# Patient Record
Sex: Male | Born: 1935 | Race: White | Hispanic: No | Marital: Married | Smoking: Never smoker
Health system: Southern US, Community
[De-identification: ages and names within clinical notes are randomized; demographics above are authoritative.]

## PROBLEM LIST (undated history)

## (undated) DIAGNOSIS — E079 Disorder of thyroid, unspecified: Secondary | ICD-10-CM

## (undated) DIAGNOSIS — N289 Disorder of kidney and ureter, unspecified: Secondary | ICD-10-CM

## (undated) DIAGNOSIS — M1990 Unspecified osteoarthritis, unspecified site: Secondary | ICD-10-CM

## (undated) DIAGNOSIS — I10 Essential (primary) hypertension: Secondary | ICD-10-CM

## (undated) DIAGNOSIS — K649 Unspecified hemorrhoids: Secondary | ICD-10-CM

## (undated) HISTORY — PX: SHOULDER SURGERY: SHX246

## (undated) HISTORY — PX: TONSILLECTOMY: SUR1361

## (undated) HISTORY — PX: ANUS SURGERY: SHX302

## (undated) HISTORY — PX: EYE SURGERY: SHX253

---

## 2019-12-26 ENCOUNTER — Other Ambulatory Visit: Payer: Self-pay

## 2019-12-26 ENCOUNTER — Emergency Department (HOSPITAL_COMMUNITY): Payer: Medicare Other

## 2019-12-26 ENCOUNTER — Encounter (HOSPITAL_COMMUNITY): Payer: Self-pay

## 2019-12-26 ENCOUNTER — Emergency Department (HOSPITAL_COMMUNITY)
Admission: EM | Admit: 2019-12-26 | Discharge: 2019-12-29 | Disposition: A | Discharge status: Home / Self Care | Payer: Medicare Other | Attending: Emergency Medicine | Admitting: Emergency Medicine

## 2019-12-26 DIAGNOSIS — R2689 Other abnormalities of gait and mobility: Secondary | ICD-10-CM | POA: Insufficient documentation

## 2019-12-26 DIAGNOSIS — R42 Dizziness and giddiness: Secondary | ICD-10-CM | POA: Diagnosis present

## 2019-12-26 DIAGNOSIS — I6200 Nontraumatic subdural hemorrhage, unspecified: Secondary | ICD-10-CM | POA: Diagnosis not present

## 2019-12-26 DIAGNOSIS — I10 Essential (primary) hypertension: Secondary | ICD-10-CM | POA: Diagnosis not present

## 2019-12-26 DIAGNOSIS — R2681 Unsteadiness on feet: Secondary | ICD-10-CM

## 2019-12-26 DIAGNOSIS — S065X9A Traumatic subdural hemorrhage with loss of consciousness of unspecified duration, initial encounter: Secondary | ICD-10-CM

## 2019-12-26 DIAGNOSIS — Z20822 Contact with and (suspected) exposure to covid-19: Secondary | ICD-10-CM | POA: Insufficient documentation

## 2019-12-26 DIAGNOSIS — S065XAA Traumatic subdural hemorrhage with loss of consciousness status unknown, initial encounter: Secondary | ICD-10-CM

## 2019-12-26 HISTORY — DX: Essential (primary) hypertension: I10

## 2019-12-26 HISTORY — DX: Unspecified osteoarthritis, unspecified site: M19.90

## 2019-12-26 HISTORY — DX: Unspecified hemorrhoids: K64.9

## 2019-12-26 HISTORY — DX: Disorder of kidney and ureter, unspecified: N28.9

## 2019-12-26 HISTORY — DX: Disorder of thyroid, unspecified: E07.9

## 2019-12-26 LAB — COMPREHENSIVE METABOLIC PANEL
ALT: 28 U/L (ref 0–44)
AST: 38 U/L (ref 15–41)
Albumin: 3.4 g/dL — ABNORMAL LOW (ref 3.5–5.0)
Alkaline Phosphatase: 134 U/L — ABNORMAL HIGH (ref 38–126)
Anion gap: 6 (ref 5–15)
BUN: 23 mg/dL (ref 8–23)
CO2: 28 mmol/L (ref 22–32)
Calcium: 9.4 mg/dL (ref 8.9–10.3)
Chloride: 106 mmol/L (ref 98–111)
Creatinine, Ser: 1.53 mg/dL — ABNORMAL HIGH (ref 0.61–1.24)
GFR calc Af Amer: 46 mL/min — ABNORMAL LOW (ref >60)
GFR calc non Af Amer: 39 mL/min — ABNORMAL LOW (ref >60)
Glucose, Bld: 128 mg/dL — ABNORMAL HIGH (ref 70–99)
Potassium: 4.4 mmol/L (ref 3.5–5.1)
Sodium: 140 mmol/L (ref 135–145)
Total Bilirubin: 0.8 mg/dL (ref 0.3–1.2)
Total Protein: 6.4 g/dL — ABNORMAL LOW (ref 6.5–8.1)

## 2019-12-26 LAB — URINALYSIS, ROUTINE W REFLEX MICROSCOPIC
Bilirubin Urine: NEGATIVE
Glucose, UA: NEGATIVE mg/dL
Hgb urine dipstick: NEGATIVE
Ketones, ur: NEGATIVE mg/dL
Leukocytes,Ua: NEGATIVE
Nitrite: NEGATIVE
Protein, ur: NEGATIVE mg/dL
Specific Gravity, Urine: 1.014 (ref 1.005–1.030)
pH: 6 (ref 5.0–8.0)

## 2019-12-26 LAB — CBC WITH DIFFERENTIAL/PLATELET
Abs Immature Granulocytes: 0.02 10*3/uL (ref 0.00–0.07)
Basophils Absolute: 0 10*3/uL (ref 0.0–0.1)
Basophils Relative: 0 %
Eosinophils Absolute: 0.1 10*3/uL (ref 0.0–0.5)
Eosinophils Relative: 2 %
HCT: 36.7 % — ABNORMAL LOW (ref 39.0–52.0)
Hemoglobin: 11.8 g/dL — ABNORMAL LOW (ref 13.0–17.0)
Immature Granulocytes: 1 %
Lymphocytes Relative: 50 %
Lymphs Abs: 1.8 10*3/uL (ref 0.7–4.0)
MCH: 35.1 pg — ABNORMAL HIGH (ref 26.0–34.0)
MCHC: 32.2 g/dL (ref 30.0–36.0)
MCV: 109.2 fL — ABNORMAL HIGH (ref 80.0–100.0)
Monocytes Absolute: 0.5 10*3/uL (ref 0.1–1.0)
Monocytes Relative: 15 %
Neutro Abs: 1.1 10*3/uL — ABNORMAL LOW (ref 1.7–7.7)
Neutrophils Relative %: 32 %
Platelets: 125 10*3/uL — ABNORMAL LOW (ref 150–400)
RBC: 3.36 MIL/uL — ABNORMAL LOW (ref 4.22–5.81)
RDW: 14.3 % (ref 11.5–15.5)
WBC: 3.5 10*3/uL — ABNORMAL LOW (ref 4.0–10.5)
nRBC: 0 % (ref 0.0–0.2)

## 2019-12-26 LAB — TROPONIN I (HIGH SENSITIVITY)
Troponin I (High Sensitivity): 10 ng/L (ref <18)
Troponin I (High Sensitivity): 11 ng/L (ref <18)

## 2019-12-26 LAB — CBG MONITORING, ED: Glucose-Capillary: 138 mg/dL — ABNORMAL HIGH (ref 70–99)

## 2019-12-26 MED ORDER — SODIUM CHLORIDE 0.9 % IV BOLUS
1000.0000 mL | Freq: Once | INTRAVENOUS | Status: AC
  Administered 2019-12-26: 12:00:00 1000 mL via INTRAVENOUS

## 2019-12-26 MED ORDER — MECLIZINE HCL 25 MG PO TABS
25.0000 mg | ORAL_TABLET | Freq: Once | ORAL | Status: AC
  Administered 2019-12-26: 13:00:00 25 mg via ORAL
  Filled 2019-12-26: qty 1

## 2019-12-26 NOTE — TOC Initial Note (Signed)
Transition of Care [facility]) - Initial/Assessment Note    Patient Details  Name: Adrian Williams MRN: [id number] Date of Birth: [birth year]  Transition of Care [facility]) CM/SW Contact:    Vergie Living, LCSW Phone Number: 12/26/2019, 8:03 PM  Clinical Narrative:  CSW met with Pt at bedside. Pt is somewhat apprehensive about SNF placement due to missing wife.  CSW spoke with wife via phone and discussed SNF. Wife is concerned about Covid but expressed understanding as to need for SNF rehab.              Expected Discharge Plan: Skilled Nursing Facility Barriers to Discharge: Continued Medical Work up   Patient Goals and CMS Choice Patient states their goals for this hospitalization and ongoing recovery are:: Get better CMS Medicare.gov Compare Post Acute Care list provided to:: Patient Choice offered to / list presented to : Patient  Expected Discharge Plan and Services Expected Discharge Plan: [place]       Living arrangements for the past 2 months: Single Family Home                                      Prior Living Arrangements/Services Living arrangements for the past 2 months: Single Family Home Lives with:: Self, Spouse Patient language and need for interpreter reviewed:: No Do you feel safe going back to the place where you live?: Yes      Need for Family Participation in Patient Care: Yes (Comment)(Wife [place]) Care giver support system in place?: Yes (comment)(Wife [place])   Criminal Activity/Legal Involvement Pertinent to Current Situation/Hospitalization: No - Comment as needed  Activities of Daily Living      Permission Sought/Granted Permission sought to share information with : Family Supports Permission granted to share information with : Yes, Verbal Permission Granted  Share Information with NAME: Nomar Broad        Permission granted to share info w Contact Information: [phone]  Emotional Assessment Appearance:: Appears  stated age Attitude/Demeanor/Rapport: Engaged Affect (typically observed): Appropriate Orientation: : Oriented to Self, Oriented to Place, Oriented to  Time, Oriented to Situation Alcohol / Substance Use: Not Applicable Psych Involvement: No (comment)  Admission diagnosis:  dizziness There are no problems to display for this patient.  PCP:  System, Pcp Not In Pharmacy:   [facility], [place] - [address] [place]  [address] Phone: [phone] Fax: [phone]     Social Determinants of Health (SDOH) Interventions    Readmission Risk Interventions No flowsheet data found.

## 2019-12-26 NOTE — Social Work (Addendum)
EDCSW met with Pt at bedside to begin [facility] assessment.   CSW spoke with Pt's wife, Juliann Pulse via phone @ [phone] to update about the SNF process   21:02 CSW faxed out FL2 to several area SNFs

## 2019-12-26 NOTE — ED Notes (Signed)
PT at bedside.

## 2019-12-26 NOTE — NC FL2 (Signed)
[place] LEVEL OF CARE SCREENING TOOL     IDENTIFICATION  Patient Name: Adrian Williams Birthdate: Mar 12, 1927 Sex: male Admission Date (Current Location): 12/26/2019  [facility] and [place] Number:  Herbalist and Address:  [facility], [place] [address] [place], [place] [address]      Provider Number: [id number]  Attending Physician Name and Address:  Isla Pence, MD  Relative Name and Phone Number:  Bruno Leach [phone]    Current Level of Care: Hospital Recommended Level of Care: [place] Prior Approval Number:    Date Approved/Denied:   PASRR Number: [id number]  Discharge Plan: SNF    Current Diagnoses: There are no problems to display for this patient.   Orientation RESPIRATION BLADDER Height & Weight     Self, Time, Situation, Place  Normal Continent Weight: 154 lb (69.9 kg) Height:  5\' 10"  (177.8 cm)  BEHAVIORAL SYMPTOMS/MOOD NEUROLOGICAL BOWEL NUTRITION STATUS      Continent Diet(Heart healthy)  AMBULATORY STATUS COMMUNICATION OF NEEDS Skin   Limited Assist Verbally Bruising(Bruising around right eye due to recent fall)                       Personal Care Assistance Level of Assistance  Bathing, Feeding, Dressing Bathing Assistance: Limited assistance Feeding assistance: Limited assistance Dressing Assistance: Limited assistance     Functional Limitations Info  Sight, Hearing, Speech Sight Info: Adequate Hearing Info: Adequate Speech Info: Adequate    SPECIAL CARE FACTORS FREQUENCY  PT (By licensed PT), OT (By licensed OT)     PT Frequency: 5x weekly OT Frequency: 5x weekly            Contractures Contractures Info: Not present    Additional Factors Info  Allergies   Allergies Info: Peaches           Current Medications (12/26/2019):  This is the current hospital active medication list No current facility-administered medications for this encounter.    Current Outpatient Medications  Medication Sig Dispense Refill  . amLODipine (NORVASC) 10 MG tablet Take 5 mg by mouth daily.    Marland Kitchen atorvastatin (LIPITOR) 40 MG tablet Take 40 mg by mouth daily at 6 PM.     . brimonidine (ALPHAGAN) 0.2 % ophthalmic solution Place 1 drop into the left eye in the morning and at bedtime.    . Buprenorphine 15 MCG/HR PTWK Apply 1 patch topically once a week.    . busPIRone (BUSPAR) 10 MG tablet Take 10 mg by mouth in the morning and at bedtime.    . calcium-vitamin D (OSCAL WITH D) 500-200 MG-UNIT tablet Take 1 tablet by mouth every Monday, Wednesday, and Friday.    . Coenzyme Q10 (COQ10 PO) Take 1 tablet by mouth daily.    . DULoxetine (CYMBALTA) 30 MG capsule Take 30 mg by mouth daily.    . finasteride (PROSCAR) 5 MG tablet Take 5 mg by mouth daily.    . furosemide (LASIX) 20 MG tablet Take 20 mg by mouth daily.    Marland Kitchen gabapentin (NEURONTIN) 100 MG capsule Take 100 mg by mouth in the morning and at bedtime.    Marland Kitchen latanoprost (XALATAN) 0.005 % ophthalmic solution Place 1 drop into the left eye at bedtime.     Marland Kitchen levothyroxine (SYNTHROID) 50 MCG tablet Take 50 mcg by mouth daily with breakfast.    . liothyronine (CYTOMEL) 5 MCG tablet Take 5 mcg by mouth daily with breakfast.    .  Multiple Vitamin (MULTIVITAMIN WITH MINERALS) TABS tablet Take 1 tablet by mouth daily.    Marland Kitchen omeprazole (PRILOSEC) 20 MG capsule Take 20 mg by mouth daily with breakfast.    . polyvinyl alcohol (LIQUIFILM TEARS) 1.4 % ophthalmic solution Place 1 drop into both eyes every 4 (four) hours as needed for dry eyes.    . Potassium 99 MG TABS Take 99 mg by mouth daily.    . tamsulosin (FLOMAX) 0.4 MG CAPS capsule Take 0.4 mg by mouth daily.    Marland Kitchen apixaban (ELIQUIS) 2.5 MG TABS tablet Take 2.5 mg by mouth 2 (two) times daily.    Marland Kitchen erythromycin ophthalmic ointment Place 1 application into the right eye in the morning, at noon, in the evening, and at bedtime.       Discharge Medications: Please  see discharge summary for a list of discharge medications.  Relevant Imaging Results:  Relevant Lab Results:   Additional Information SSN [id number]  Shella Spearing, LCSW

## 2019-12-26 NOTE — ED Notes (Signed)
Wife updated per request 

## 2019-12-26 NOTE — ED Notes (Signed)
Patient transported to CT 

## 2019-12-26 NOTE — Consult Note (Signed)
Neurosurgery Consultation  Reason for Consult: Subdural hematoma Referring Physician: Particia Williams  CC: Dizziness  HPI: This is a 86 y.o. man with h/o PAF on eliquis that presents with repeated falls 2/2 dizziness. He denies any new weakness, numbness, or parasthesias, no headaches / nausea / vomiting. He has had two large falls this month and was evaluated at [facility], per their records, they diagnosed a 32mm chronic SDH at that time with a few mm of midline shift. Since that time, he has been off of his eliquis.    ROS: A 14 point ROS was performed and is negative except as noted in the HPI.   PMHx:  Past Medical History:  Diagnosis Date  . Arthritis   . Hemorrhoids   . Hypertension   . Renal disorder   . Thyroid disease    FamHx: No family history on file. SocHx:  reports that he has never smoked. He has quit using smokeless tobacco.  His smokeless tobacco use included chew. He reports previous alcohol use. He reports that he does not use drugs.  Exam: Vital signs in last 24 hours: Temp:  [98.8 F (37.1 C)] 98.8 F (37.1 C) (02/26 1132) Pulse Rate:  [61-96] 96 (02/26 1445) Resp:  [10-23] 15 (02/26 1445) BP: (114-155)/(64-75) 139/75 (02/26 1445) SpO2:  [98 %-100 %] 98 % (02/26 1445) Weight:  [69.9 kg] 69.9 kg (02/26 1134) General: Awake, alert, cooperative, lying in bed in NAD Head: normocephalic, +trauma to the right maxilla HEENT: neck supple Pulmonary: breathing room air comfortably, no evidence of increased work of breathing Cardiac: RRR, P waves present on monitor  Abdomen: S NT ND Extremities: warm and well perfused x4 Neuro: AOx3, light perception only OS, dec'd vision OD (baseline), FS & symmetrically sensate, FTN symmetric Strength 5/5 x4, SILTx4, no drift   Assessment and Plan: 86 y.o. man s/p repeated falls, PAF but off eliquis due to recently diagnosed SDH. CTH personally reviewed, which shows left 95mm hypodense subdural collection, likely subacute/chronic SDH with 22mm  of midline shift.   -no acute neurosurgical intervention indicated at this time -pt already off his eliquis, given his falls, recommend he stays off it until the balance is resolved. No other signs of a [place] s/d on exam. -advised pt he can keep his previously scheduled f/u appt with NSGY at [facility] but if he would prefer, he can follow up with me in 3 weeks, our appointment number is [phone]  Adrian Pierini, MD 12/26/19 3:25 PM [facility]

## 2019-12-26 NOTE — ED Provider Notes (Signed)
[facility] EMERGENCY DEPARTMENT Provider Note   CSN: [id number] Arrival date & time: 12/26/19  1126     History Chief Complaint  Patient presents with  . Dizziness    Adrian Williams is a 86 y.o. male.  Pt presents to the ED today with dizziness.  Pt has had several falls recently for which he's been seen at [facility].  The pt hit his right eye and had surgery to the eye.  He fell again and had a CT head which showed a SDH.  The pt was seen by NS and it was felt it was due to his Eliquis and to stop the Eliquis.  No surgery needed.  Pt's wife said she's been unable to get him out of bed due to severe dizziness yesterday and today.  Pt denies dizziness unless he turns his head or stands up.         Past Medical History:  Diagnosis Date  . Arthritis   . Hemorrhoids   . Hypertension   . Renal disorder   . Thyroid disease     There are no problems to display for this patient.   Past Surgical History:  Procedure Laterality Date  . ANUS SURGERY    . EYE SURGERY    . SHOULDER SURGERY    . TONSILLECTOMY         No family history on file.  Social History   Tobacco Use  . Smoking status: Never Smoker  . Smokeless tobacco: Former Neurosurgeon    Types: Chew  Substance Use Topics  . Alcohol use: Not Currently  . Drug use: Never    Home Medications Prior to Admission medications   Not on File    Allergies    Peach [prunus persica]  Review of Systems   Review of Systems  Neurological: Positive for dizziness.  All other systems reviewed and are negative.   Physical Exam Updated Vital Signs BP 139/75   Pulse 96   Temp 98.8 F (37.1 C) (Oral)   Resp 15   Ht 5\' 10"  (1.778 m)   Wt 69.9 kg   SpO2 98%   BMI 22.10 kg/m   Physical Exam Vitals and nursing note reviewed.  Constitutional:      Appearance: Normal appearance.  HENT:     Head: Normocephalic.     Right Ear: External ear normal.     Left Ear: External ear normal.     Nose: Nose  normal.     Mouth/Throat:     Mouth: Mucous membranes are moist.     Pharynx: Oropharynx is clear.  Eyes:     Comments: Left eye with glaucoma.  Nearly blind.  Right eye with a dislocated lens.  Surgery was planned for today.  Cardiovascular:     Rate and Rhythm: Normal rate and regular rhythm.     Pulses: Normal pulses.     Heart sounds: Normal heart sounds.  Pulmonary:     Effort: Pulmonary effort is normal.     Breath sounds: Normal breath sounds.  Abdominal:     General: Abdomen is flat. Bowel sounds are normal.     Palpations: Abdomen is soft.  Musculoskeletal:        General: Normal range of motion.     Cervical back: Normal range of motion and neck supple.  Skin:    General: Skin is warm.     Capillary Refill: Capillary refill takes less than 2 seconds.  Neurological:  General: No focal deficit present.     Mental Status: He is alert and oriented to person, place, and time.  Psychiatric:        Mood and Affect: Mood normal.        Behavior: Behavior normal.        Thought Content: Thought content normal.        Judgment: Judgment normal.     ED Results / Procedures / Treatments   Labs (all labs ordered are listed, but only abnormal results are displayed) Labs Reviewed  COMPREHENSIVE METABOLIC PANEL - Abnormal; Notable for the following components:      Result Value   Glucose, Bld 128 (*)    Creatinine, Ser 1.53 (*)    Total Protein 6.4 (*)    Albumin 3.4 (*)    Alkaline Phosphatase 134 (*)    GFR calc non Af Amer 39 (*)    GFR calc Af Amer 46 (*)    All other components within normal limits  CBC WITH DIFFERENTIAL/PLATELET - Abnormal; Notable for the following components:   WBC 3.5 (*)    RBC 3.36 (*)    Hemoglobin 11.8 (*)    HCT 36.7 (*)    MCV 109.2 (*)    MCH 35.1 (*)    Platelets 125 (*)    Neutro Abs 1.1 (*)    All other components within normal limits  CBG MONITORING, ED - Abnormal; Notable for the following components:   Glucose-Capillary  138 (*)    All other components within normal limits  URINALYSIS, ROUTINE W REFLEX MICROSCOPIC  TROPONIN I (HIGH SENSITIVITY)  TROPONIN I (HIGH SENSITIVITY)    EKG EKG Interpretation  Date/Time:  Friday December 26 2019 11:32:11 EST Ventricular Rate:  68 PR Interval:    QRS Duration: 94 QT Interval:  374 QTC Calculation: 398 R Axis:   12 Text Interpretation: Sinus rhythm Supraventricular bigeminy Low voltage, precordial leads Abnormal R-wave progression, early transition No old tracing to compare Confirmed by Isla Pence [phone]) on 12/26/2019 11:58:03 AM   Radiology CT Head Wo Contrast  Result Date: 12/26/2019 CLINICAL DATA:  Dizziness EXAM: CT HEAD WITHOUT CONTRAST TECHNIQUE: Contiguous axial images were obtained from the base of the skull through the vertex without intravenous contrast. COMPARISON:  12/22/2019. FINDINGS: Brain: Crescentic fluid collection overlying the left cerebral convexity which is slightly hyperattenuating relative to CSF suggesting a subacute to chronic left subdural hematoma. Collection measures up to 8 mm in thickness. 3 mm left-to-right shift of the midline structures. No intraparenchymal hemorrhage. No hydrocephalus. Gray-white matter differentiation appears preserved without evidence of acute infarction. Mild diffuse cerebral volume loss. Vascular: Atherosclerotic calcifications involving the large vessels of the skull base. No unexpected hyperdense vessel. Skull: Normal. Negative for fracture or focal lesion. Sinuses/Orbits: No acute finding. Other: None. IMPRESSION: Subacute to chronic left subdural hematoma measuring up to 8 mm in thickness with 3 mm left-to-right shift of the midline structures, not substantially changed from the previous CT. Electronically Signed   By: Davina Poke D.O.   On: 12/26/2019 13:34   DG Chest Portable 1 View  Result Date: 12/26/2019 CLINICAL DATA:  Dizziness, productive cough. EXAM: PORTABLE CHEST 1 VIEW COMPARISON:   None. FINDINGS: The heart size and mediastinal contours are within normal limits. Both lungs are clear. The visualized skeletal structures are unremarkable. IMPRESSION: No active disease. Aortic Atherosclerosis (ICD10-I70.0). Electronically Signed   By: Marijo Conception M.D.   On: 12/26/2019 12:04    Procedures Procedures (including  critical care time)  Medications Ordered in ED Medications  sodium chloride 0.9 % bolus 1,000 mL (0 mLs Intravenous Stopped 12/26/19 1347)  meclizine (ANTIVERT) tablet 25 mg (25 mg Oral Given 12/26/19 1313)    ED Course  I have reviewed the triage vital signs and the nursing notes.  Pertinent labs & imaging results that were available during my care of the patient were reviewed by me and considered in my medical decision making (see chart for details).    MDM Rules/Calculators/A&P                      SDH does not look any different to the radiologist than recent scans.    Pt d/w NS ([place]) to make sure he would not recommend surgery.  Meds have not been put in, so I have not ordered home meds.  I asked pharm tech to put them in the chart.  Diet ordered.  I have made multiple calls to the wife and have not heard back.  She has not called here or made contact with any of the nurses.  I will consult PT to eval and treat.  SW/CM consulted as well for placement.    Final Clinical Impression(s) / ED Diagnoses Final diagnoses:  Dizziness  Gait instability  SDH (subdural hematoma) (HCC)    Rx / DC Orders ED Discharge Orders    None       Jacalyn Lefevre, MD 12/26/19 1534

## 2019-12-26 NOTE — ED Triage Notes (Addendum)
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

## 2019-12-26 NOTE — ED Notes (Signed)
Pt transported to CT ?

## 2019-12-26 NOTE — Evaluation (Signed)
Physical Therapy Evaluation Patient Details Name: Adrian Williams MRN: [id number] DOB: Feb 11, 1927 Today's Date: 12/26/2019   History of Present Illness  Pt is a 86 y/o male presenting to the ED secondary to multiple falls. Pt with recent presentations to [facility] after falls that resulted in subdural hematoma and eye injury requiring surgery. PMH includes HTN.   Clinical Impression  Pt presenting with problem above with deficits below. Pt requiring min to mod A to take side steps at EOB. Was asymptomatic until return to supine. Pt with multiple falls at home. Feel pt would benefit from SNF level therapies at d/c. Will continue to follow acutely to maximize functional mobility independence and safety.     Follow Up Recommendations SNF;Supervision/Assistance - 24 hour    Equipment Recommendations  None recommended by PT    Recommendations for Other Services       Precautions / Restrictions Precautions Precautions: Fall Precaution Comments: Multiple falls within the past month Restrictions Weight Bearing Restrictions: No      Mobility  Bed Mobility Overal bed mobility: Needs Assistance Bed Mobility: Supine to Sit;Sit to Supine     Supine to sit: Min assist Sit to supine: Min assist   General bed mobility comments: Min A for trunk assist and LE assist. Pt with increased dizziness when returning to supine.   Transfers Overall transfer level: Needs assistance Equipment used: 1 person hand held assist Transfers: Sit to/from Stand Sit to Stand: Min assist         General transfer comment: Min A for lift assist and steadying. PT stood in front of pt and had pt hold to PT arms.   Ambulation/Gait Ambulation/Gait assistance: Mod assist   Assistive device: 1 person hand held assist       General Gait Details: Mod A for steadying assist while taking side steps at EOB. Pt denies dizziness.   Stairs            Wheelchair Mobility    Modified Rankin (Stroke Patients  Only)       Balance Overall balance assessment: Needs assistance Sitting-balance support: No upper extremity supported;Feet supported Sitting balance-Leahy Scale: Fair     Standing balance support: Bilateral upper extremity supported;During functional activity Standing balance-Leahy Scale: Poor Standing balance comment: Reliant on UE and external support                              Pertinent Vitals/Pain Pain Assessment: No/denies pain    Home Living Family/patient expects to be discharged to:: Private residence Living Arrangements: Spouse/significant other Available Help at Discharge: Family Type of Home: House Home Access: Stairs to enter Entrance Stairs-Rails: Right Entrance Stairs-Number of Steps: 4-5 Home Layout: One level Home Equipment: Walker - 2 wheels      Prior Function Level of Independence: Independent with assistive device(s)         Comments: Has been using RW for ambulation since falls. Continued to fall with use of RW.      Hand Dominance        Extremity/Trunk Assessment   Upper Extremity Assessment Upper Extremity Assessment: Generalized weakness    Lower Extremity Assessment Lower Extremity Assessment: Generalized weakness    Cervical / Trunk Assessment Cervical / Trunk Assessment: Kyphotic  Communication   Communication: No difficulties  Cognition Arousal/Alertness: Awake/alert Behavior During Therapy: WFL for tasks assessed/performed Overall Cognitive Status: No family/caregiver present to determine baseline cognitive functioning  General Comments: Pt with questionable memory deficits and slowed processing.       General Comments      Exercises     Assessment/Plan    PT Assessment Patient needs continued PT services  PT Problem List Decreased strength;Decreased balance;Decreased mobility;Decreased knowledge of use of DME;Decreased safety awareness;Decreased  cognition;Decreased knowledge of precautions       PT Treatment Interventions Gait training;Functional mobility training;DME instruction;Therapeutic activities;Therapeutic exercise;Balance training;Patient/family education;Cognitive remediation    PT Goals (Current goals can be found in the Care Plan section)  Acute Rehab PT Goals Patient Stated Goal: for dizziness to get better PT Goal Formulation: With patient Time For Goal Achievement: 01/09/20 Potential to Achieve Goals: Fair    Frequency Min 2X/week   Barriers to discharge        Co-evaluation               AM-PAC PT "6 Clicks" Mobility  Outcome Measure Help needed turning from your back to your side while in a flat bed without using bedrails?: A Little Help needed moving from lying on your back to sitting on the side of a flat bed without using bedrails?: A Little Help needed moving to and from a bed to a chair (including a wheelchair)?: A Lot Help needed standing up from a chair using your arms (e.g., wheelchair or bedside chair)?: A Little Help needed to walk in hospital room?: A Lot Help needed climbing 3-5 steps with a railing? : Total 6 Click Score: 14    End of Session Equipment Utilized During Treatment: Gait belt Activity Tolerance: Patient tolerated treatment well Patient left: in bed;with call bell/phone within reach Nurse Communication: Mobility status PT Visit Diagnosis: Unsteadiness on feet (R26.81);Muscle weakness (generalized) (M62.81)    Time: 8657-8469 PT Time Calculation (min) (ACUTE ONLY): 20 min   Charges:   PT Evaluation $PT Eval Moderate Complexity: 1 Mod          Reuel Derby, PT, DPT  Acute Rehabilitation Services  Pager: [phone] Office: [phone]   Rudean Hitt 12/26/2019, 5:03 PM

## 2019-12-27 MED ORDER — CALCIUM CARBONATE-VITAMIN D 500-200 MG-UNIT PO TABS
1.0000 | ORAL_TABLET | ORAL | Status: DC
  Filled 2019-12-27: qty 1

## 2019-12-27 MED ORDER — BUPRENORPHINE 15 MCG/HR TD PTWK: 1.0000 | MEDICATED_PATCH | TRANSDERMAL | Status: DC

## 2019-12-27 MED ORDER — BUSPIRONE HCL 10 MG PO TABS
10.0000 mg | ORAL_TABLET | Freq: Two times a day (BID) | ORAL | Status: DC
  Administered 2019-12-27 – 2019-12-29 (×5): 10 mg via ORAL
  Filled 2019-12-27 (×5): qty 1

## 2019-12-27 MED ORDER — FUROSEMIDE 20 MG PO TABS
20.0000 mg | ORAL_TABLET | Freq: Every day | ORAL | Status: DC
  Administered 2019-12-27 – 2019-12-29 (×3): 20 mg via ORAL
  Filled 2019-12-27 (×3): qty 1

## 2019-12-27 MED ORDER — FINASTERIDE 5 MG PO TABS
5.0000 mg | ORAL_TABLET | Freq: Every day | ORAL | Status: DC
  Administered 2019-12-27 – 2019-12-28 (×2): 5 mg via ORAL
  Filled 2019-12-27 (×3): qty 1

## 2019-12-27 MED ORDER — LATANOPROST 0.005 % OP SOLN
1.0000 [drp] | Freq: Every day | OPHTHALMIC | Status: DC
  Administered 2019-12-27 – 2019-12-28 (×2): 1 [drp] via OPHTHALMIC
  Filled 2019-12-27: qty 2.5

## 2019-12-27 MED ORDER — POTASSIUM 99 MG PO TABS: 99.0000 mg | ORAL_TABLET | Freq: Every day | ORAL | Status: DC

## 2019-12-27 MED ORDER — DULOXETINE HCL 30 MG PO CPEP
30.0000 mg | ORAL_CAPSULE | Freq: Every day | ORAL | Status: DC
  Administered 2019-12-27 – 2019-12-29 (×3): 30 mg via ORAL
  Filled 2019-12-27 (×3): qty 1

## 2019-12-27 MED ORDER — AMLODIPINE BESYLATE 5 MG PO TABS
5.0000 mg | ORAL_TABLET | Freq: Every day | ORAL | Status: DC
  Administered 2019-12-27 – 2019-12-29 (×3): 5 mg via ORAL
  Filled 2019-12-27 (×3): qty 1

## 2019-12-27 MED ORDER — MECLIZINE HCL 25 MG PO TABS
12.5000 mg | ORAL_TABLET | Freq: Once | ORAL | Status: AC
  Administered 2019-12-27: 12.5 mg via ORAL
  Filled 2019-12-27: qty 1

## 2019-12-27 MED ORDER — ADULT MULTIVITAMIN W/MINERALS CH
1.0000 | ORAL_TABLET | Freq: Every day | ORAL | Status: DC
  Administered 2019-12-27 – 2019-12-29 (×4): 1 via ORAL
  Filled 2019-12-27 (×3): qty 1

## 2019-12-27 MED ORDER — POLYVINYL ALCOHOL 1.4 % OP SOLN: 1.0000 [drp] | OPHTHALMIC | Status: DC | PRN

## 2019-12-27 MED ORDER — LEVOTHYROXINE SODIUM 50 MCG PO TABS
50.0000 ug | ORAL_TABLET | Freq: Every day | ORAL | Status: DC
  Administered 2019-12-28 – 2019-12-29 (×2): 50 ug via ORAL
  Filled 2019-12-27 (×2): qty 1

## 2019-12-27 MED ORDER — BRIMONIDINE TARTRATE 0.2 % OP SOLN
1.0000 [drp] | Freq: Two times a day (BID) | OPHTHALMIC | Status: DC
  Administered 2019-12-27 – 2019-12-29 (×5): 1 [drp] via OPHTHALMIC
  Filled 2019-12-27: qty 5

## 2019-12-27 MED ORDER — ATORVASTATIN CALCIUM 40 MG PO TABS
40.0000 mg | ORAL_TABLET | Freq: Every day | ORAL | Status: DC
  Administered 2019-12-27 – 2019-12-28 (×2): 40 mg via ORAL
  Filled 2019-12-27 (×2): qty 1

## 2019-12-27 MED ORDER — GABAPENTIN 100 MG PO CAPS
100.0000 mg | ORAL_CAPSULE | Freq: Two times a day (BID) | ORAL | Status: DC
  Administered 2019-12-27 (×2): 100 mg via ORAL
  Filled 2019-12-27 (×2): qty 1

## 2019-12-27 MED ORDER — LIOTHYRONINE SODIUM 5 MCG PO TABS
5.0000 ug | ORAL_TABLET | Freq: Every day | ORAL | Status: DC
  Administered 2019-12-27 – 2019-12-29 (×3): 5 ug via ORAL
  Filled 2019-12-27 (×6): qty 1

## 2019-12-27 MED ORDER — TAMSULOSIN HCL 0.4 MG PO CAPS
0.4000 mg | ORAL_CAPSULE | Freq: Every day | ORAL | Status: DC
  Administered 2019-12-27 – 2019-12-29 (×3): 0.4 mg via ORAL
  Filled 2019-12-27 (×4): qty 1

## 2019-12-27 MED ORDER — PANTOPRAZOLE SODIUM 40 MG PO TBEC
40.0000 mg | DELAYED_RELEASE_TABLET | Freq: Every day | ORAL | Status: DC
  Administered 2019-12-27 – 2019-12-29 (×4): 40 mg via ORAL
  Filled 2019-12-27 (×3): qty 1

## 2019-12-27 NOTE — ED Notes (Signed)
Pt transferred into hospital bed for comfort.

## 2019-12-27 NOTE — ED Notes (Signed)
Spoke with wife and she has multiple questions and requests.  Msg sent to ED to call.  Pt also requesting Meclizine, medication for constipation.   States last BM 5 days ago.

## 2019-12-27 NOTE — ED Notes (Signed)
This RN paged pharmacy to send missing medications.  

## 2019-12-27 NOTE — ED Notes (Signed)
Breakfast at bedside.

## 2019-12-27 NOTE — ED Notes (Signed)
This RN requested per pts request, something for dizziness and something for constipation; awaiting further orders.

## 2019-12-27 NOTE — ED Notes (Signed)
WifeOlegario Williams [phone] for an update

## 2019-12-27 NOTE — ED Notes (Signed)
Breakfast ordered 

## 2019-12-27 NOTE — ED Notes (Signed)
Updated pt's wife; pt speaking on phone with wife

## 2019-12-28 NOTE — ED Notes (Signed)
Assumed care of pt. Pt alert, resting on cart in NAD. Breathing easy, non-labored. VSS. Equal rise and fall of chest completed. Pt denies any current needs at this time. Will continue to monitor. Call light within reach

## 2019-12-28 NOTE — TOC Progression Note (Signed)
Transition of Care [facility]) - Progression Note    Patient Details  Name: Adrian Williams MRN: [id number] Date of Birth: 1927-10-03  Transition of Care [facility]) CM/SW Contact  Annalee Genta, LCSW Phone Number: 12/28/2019, 10:40 AM  Clinical Narrative:  CSW spoke with patient and spouse regarding the three bed offers for [facility], [facility], or [facility]. CSW noted wife declined these beds and affirmed he would need to be in a good facility rated at least a 4. CSW discussed faxing out to [place] and [place] to have more 4 star facilities review and noted consent is declined. Spouse reports she would like to see if patient could come with home care once his confusion improves. CSW discussed home health as an option with the wife and will follow up with patient and wife regarding consent to arrange services.   Expected Discharge Plan: Skilled Nursing Facility Barriers to Discharge: Continued Medical Work up  Expected Discharge Plan and Services Expected Discharge Plan: Skilled Nursing Facility       Living arrangements for the past 2 months: Single Family Home                                       Social Determinants of Health (SDOH) Interventions    Readmission Risk Interventions No flowsheet data found.

## 2019-12-28 NOTE — ED Notes (Signed)
TC to PT's wife  With update . PT's wife reported she spoke SW and does not want  Pt's  To go to SNF.  Wife  Reports she wants Pt to come home.

## 2019-12-28 NOTE — ED Notes (Signed)
Meal ordered

## 2019-12-28 NOTE — ED Notes (Signed)
All meds given per MAR. Name/DOB verified with pt 

## 2019-12-28 NOTE — ED Notes (Signed)
Cytomel given per MAR. Name/DOB verified with pt

## 2019-12-28 NOTE — ED Notes (Signed)
Dinner tray provided

## 2019-12-28 NOTE — ED Notes (Signed)
Patient wife Adrian Williams asking for a call back tonight she's got some questions about his care  [phone]

## 2019-12-28 NOTE — ED Notes (Signed)
Assumed care of pt. Pt resting on cart in NAD. Breathing easy, non-labored. Equal rise and fall of chest noted. VSS on monitors. Call light within reach. Hourly rounds completed. Will continue to monitor

## 2019-12-28 NOTE — ED Notes (Signed)
Care endorsed to Audrey, RN.

## 2019-12-29 LAB — RESPIRATORY PANEL BY RT PCR (FLU A&B, COVID)
Influenza A by PCR: NEGATIVE
Influenza B by PCR: NEGATIVE
SARS Coronavirus 2 by RT PCR: NEGATIVE

## 2019-12-29 MED ORDER — MECLIZINE HCL 25 MG PO TABS: 25.0000 mg | ORAL_TABLET | Freq: Three times a day (TID) | ORAL | 0 refills | Status: AC | PRN

## 2019-12-29 MED ORDER — MECLIZINE HCL 25 MG PO TABS
25.0000 mg | ORAL_TABLET | Freq: Once | ORAL | Status: AC
  Administered 2019-12-29: 17:00:00 25 mg via ORAL
  Filled 2019-12-29: qty 1

## 2019-12-29 MED ORDER — ONDANSETRON HCL 4 MG/2ML IJ SOLN
4.0000 mg | Freq: Once | INTRAMUSCULAR | Status: AC
  Administered 2019-12-29: 4 mg via INTRAVENOUS
  Filled 2019-12-29: qty 2

## 2019-12-29 MED ORDER — ONDANSETRON 4 MG PO TBDP: 4.0000 mg | ORAL_TABLET | Freq: Three times a day (TID) | ORAL | 0 refills | Status: AC | PRN

## 2019-12-29 NOTE — ED Notes (Signed)
Cytomel given per MAR. Name/DOB verified with pt 

## 2019-12-29 NOTE — ED Notes (Signed)
Attempted to call report for [facility]. No answer

## 2019-12-29 NOTE — Progress Notes (Signed)
CSW emailed AVS to facility.   Number for report [phone] Room # [id number]

## 2019-12-29 NOTE — ED Notes (Signed)
Called wife and left message about pt transport.

## 2019-12-29 NOTE — ED Notes (Signed)
Pt remains resting on cart in NAD. Breathing easy, non-labored. Equal rise and fall of chest noted. Hourly rounds completed. Call light within reach. Will continue to monitor

## 2019-12-29 NOTE — ED Notes (Signed)
800 cc of urine emptied from condom catheter

## 2019-12-29 NOTE — ED Notes (Signed)
Hourly rounds completed. Pt resting on cart in NAD. Breathing easy, non-labored. VSS on monitors. Equal rise and fall of chest noted. Hourly rounds completed. Will continue to monitor, call light within reach. Bed locked, in lowest position, side rails upx2

## 2019-12-29 NOTE — ED Notes (Signed)
Care endorsed to Audrey, RN.

## 2019-12-29 NOTE — Consult Note (Signed)
Referring Physician:  Makel Mcmann is an 86 y.o. male.                       Chief Complaint: Dizziness  HPI: 86 years old white male with recurrent episodes of dizziness and fall has significant weakness. Patient denies chest pain. Wife, cathy can not take care of him due to her advance age and weakness also. Patient has subacute SDH with small midline shift. His Eliquis is discontinued. He is going to a SNF till his condition improves.  Past Medical History:  Diagnosis Date  . Arthritis   . Hemorrhoids   . Hypertension   . Renal disorder   . Thyroid disease       Past Surgical History:  Procedure Laterality Date  . ANUS SURGERY    . EYE SURGERY    . SHOULDER SURGERY    . TONSILLECTOMY      No family history on file. Social History:  reports that he has never smoked. He has quit using smokeless tobacco.  His smokeless tobacco use included chew. He reports previous alcohol use. He reports that he does not use drugs.  Allergies:  Allergies  Allergen Reactions  . Peach [Prunus Persica] Other (See Comments)    "burning in stomach"    (Not in a hospital admission)   No results found for this or any previous visit (from the past 48 hour(s)). No results found.  Review Of Systems Constitutional: No fever, chills, Chronic weight loss. Eyes: No vision change, wears glasses. No discharge or pain. Ears: No hearing loss, No tinnitus. Respiratory: No asthma, COPD, pneumonias. Positive shortness of breath. No hemoptysis. Cardiovascular: Positive chest pain, palpitation, leg edema. Gastrointestinal: No nausea, vomiting, diarrhea, constipation. No GI bleed. No hepatitis. Genitourinary: No dysuria, hematuria, kidney stone. No incontinance. Neurological: No headache, stroke, seizures. Legally blind Psychiatry: No psych facility admission for anxiety, depression, suicide. No detox. Skin: No rash. Musculoskeletal: No joint pain, fibromyalgia. No neck pain, back pain. Lymphadenopathy:  No lymphadenopathy. Hematology: No anemia or easy bruising.   Blood pressure 140/67, pulse 71, temperature 98.8 F (37.1 C), temperature source Oral, resp. rate 13, height 5\' 10"  (1.778 m), weight 69.9 kg, SpO2 92 %. Body mass index is 22.1 kg/m. General appearance: alert, cooperative, appears stated age and no distress Head: Normocephalic, atraumatic. Eyes: Blue eyes, pink conjunctiva, corneas hazy.Sclera: non-icteric. Neck: No adenopathy, no carotid bruit, no JVD, supple, symmetrical, trachea midline and thyroid not enlarged. Resp: Clear to auscultation bilaterally. Cardio: Regular rate and rhythm, S1, S2 normal, II/VI systolic murmur, no click, rub or gallop GI: Soft, non-tender; bowel sounds normal; no organomegaly. Extremities: No edema, cyanosis or clubbing. Skin: Warm and dry.  Neurologic: Alert and oriented X 3, normal strength. Normal coordination. Legally blind. Generalized weakness and unable to stand.  Assessment/Plan Dizziness Positional vertigo Legally blind Hypertension Hypothyroidism CKD, IIIa Generalized weakness Moderate protein calorie malnutrition  Agree with SNF placement. May need 24 hour care at home with wheelchair use. Discuss care with patient and wife.  Time spent: Review of old records, Lab, x-rays, EKG, other cardiac tests, examination, discussion with patient over 70 minutes.  Marland Kitchen, MD  12/29/2019, 12:45 PM

## 2019-12-29 NOTE — Progress Notes (Addendum)
1pm: CSW received call from Janie at [facility] who states this patient has been offered a bed for admission today.  CSW spoke with patient's wife Olegario Messier to inform her of discharge plan. Olegario Messier is agreeable to discharge plan for patient to go to [facility], Olegario Messier is also on the phone with the patient and he is agreeable also. CSW provided Bishop with Janie's contact information to coordinate a time to complete paperwork.  CSW initiated insurance authorization through [facility].  11:30am: CSW spoke with Mart, [place] who had a conversation with the patient's wife regarding options for discharge planning. Wife was more agreeable to SNF placement, potentially at [facility].  CSW spoke with Wille Celeste at [facility] who is waiting a final decision from the administrator regarding new admissions, Wille Celeste to return call to CSW ASAP.  9am: CSW spoke with patient's wife Olegario Messier at [phone] to discuss the discharge plan. Olegario Messier is requesting to speak with a MD about her husband's dizziness. Olegario Messier is adamant that she will not allow her husband to go to a facility that is not 4 or 5 stars due to COVID. CSW explained that regardless of Medicare rating, that facilities may have COVID positive residents or staff.   CSW spoke with Gila, [place] regarding patient. PA to reach out to patient's wife for discussion.  8am: CSW spoke with Magda Paganini, RN regarding patient, RN reports patient is alert and oriented without significant confusion.  CSW spoke with Tresa Endo at [facility] who states she is unable to offer this patient a bed due to no availability.  Edwin Dada, MSW, LCSW-A Transitions of Care  Clinical Social Worker  [facility]  Medical ICU [phone]

## 2019-12-29 NOTE — ED Notes (Signed)
Pt resting on cart in NAD. Breathing easy, non-labored. Speaking in full sentences. VSS on monitors. Equal rise and fall of chest noted. Hourly rounds completed. Will continue to monitor, call light within reach. Bed locked, in lowest position, side rails upx2

## 2019-12-29 NOTE — ED Notes (Signed)
Pt washed and dressed, waiting for transport. [facility] has been called for transport.

## 2019-12-29 NOTE — ED Notes (Signed)
Pt resting in bed in NAD. Pt denies new or worsening complaints. Will continue to monitor. Call light remains in reach. Pt on continuous monitoring via blood pressure, pulse ox, and cardiac monitor. Bed in lowest position, locked, with siderails upx2

## 2019-12-29 NOTE — ED Notes (Signed)
Breakfast Ordered 

## 2019-12-29 NOTE — Progress Notes (Signed)
CSW received a call from [facility] stating they are ready to call report.  CSW spoke to [facility] ED RN who stated RN attempted to call report.  CSW provided name of pt and room # at the SNF and stated RN would call.  Please reconsult if future social work needs arise.  CSW signing off, as social work intervention is no longer needed.  Dorothe Pea. Rayaan Lorah, LCSW, LCAS, CSI Transitions of Care Clinical Social Worker Care Coordination Department Ph: [phone]

## 2019-12-29 NOTE — ED Notes (Signed)
Pt resting on cart in NAD. Breathing easy, non-labored. Speaking in full sentences. VSS on monitors. Equal rise and fall of chest noted. Hourly rounds completed. Will continue to monitor, call light within reach

## 2019-12-29 NOTE — ED Notes (Signed)
Received a phone call from shire at the [facility]. Gave report.

## 2019-12-29 NOTE — ED Provider Notes (Signed)
  Patient in the ED as a border, awaiting SNF placement. Okey Regal, Child psychotherapist, and spoken with the patient's wife.  There were 2 facilities that were willing to accept the patient, however, patient's wife was resistant to these facilities due to their online ratings.  She was also upset because she "had only spoken to the nurses and not a provider." Okey Regal tells me she can likely place the patient today at one of the 2 available facilities, but needs the wife's okay.  She also tells me home health is not really an option for the patient due to insurance concerns.  Patient has had complaint of dizziness.  He has had multiple falls with multiple head injuries.  He has a known, but apparently stable, SDH.  He has been evaluated by neurosurgery, who states patient is not a surgical candidate and he may follow-up in the office.  When I spoke with the patient, he does have some apparent confusion, which I understand to be his baseline.  He has apparent appropriate function in his extremities.  He is able to carry on a conversation. He states the meclizine has improved his symptoms.  He also voices intermittent nausea.  We will provide prescriptions for meclizine and Zofran.  Social work was able to secure placement for the patient at [facility].  I processed his discharge accordingly.   Patient's case and plan of care was discussed with Theda Belfast, MD.   Clinical Course as of Dec 29 1233  Mon Dec 29, 2019  1036 Spoke with patient's wife, Olegario Messier, via phone. This was quite the lengthy conversation, spanning at least 30 minutes. She voiced her concerns regarding the online ratings of the 2 available facilities.  She is concerned that facilities with lower ratings would have more COVID-19 infections.  We discussed the fact that, in my opinion, it is likely that there is possible Covid infection in any facility or any place where people are in the same building, for that matter. We discussed the pros and  cons of home health, which I made clear to the wife that this would include people coming into their home. We discussed that there would be risk for COVID-19 infection either way. She asked about bringing the patient home and caring for him herself.  However, she has concerns because while he has been at home the last week she has not been able to bathe him or help him to the bathroom.  I told her that I think these are significant concerns and that he would be able to get this care at a skilled nursing facility because they are equipped to do so. After our discussion, she agreed to allow placement.  She shows preference towards [facility].   [SJ]  1103 Updated Okey Regal, SW, on conversation with the patient's wife and her approval to move ahead with placement. Requests 2 hour covid testing.   [SJ]    Clinical Course User Index [SJ] Precious Gilding, [place] 12/30/19 1235    Tegeler, Canary Brim, MD 01/02/20 [phone]

## 2021-01-02 IMAGING — DX DG CHEST 1V PORT
2 series · 2 of 2 positions shown · non-contrast
Comparison: None.

CLINICAL DATA: Dizziness, productive cough.

EXAM:
PORTABLE CHEST 1 VIEW

[chest ap (1 of 2)]
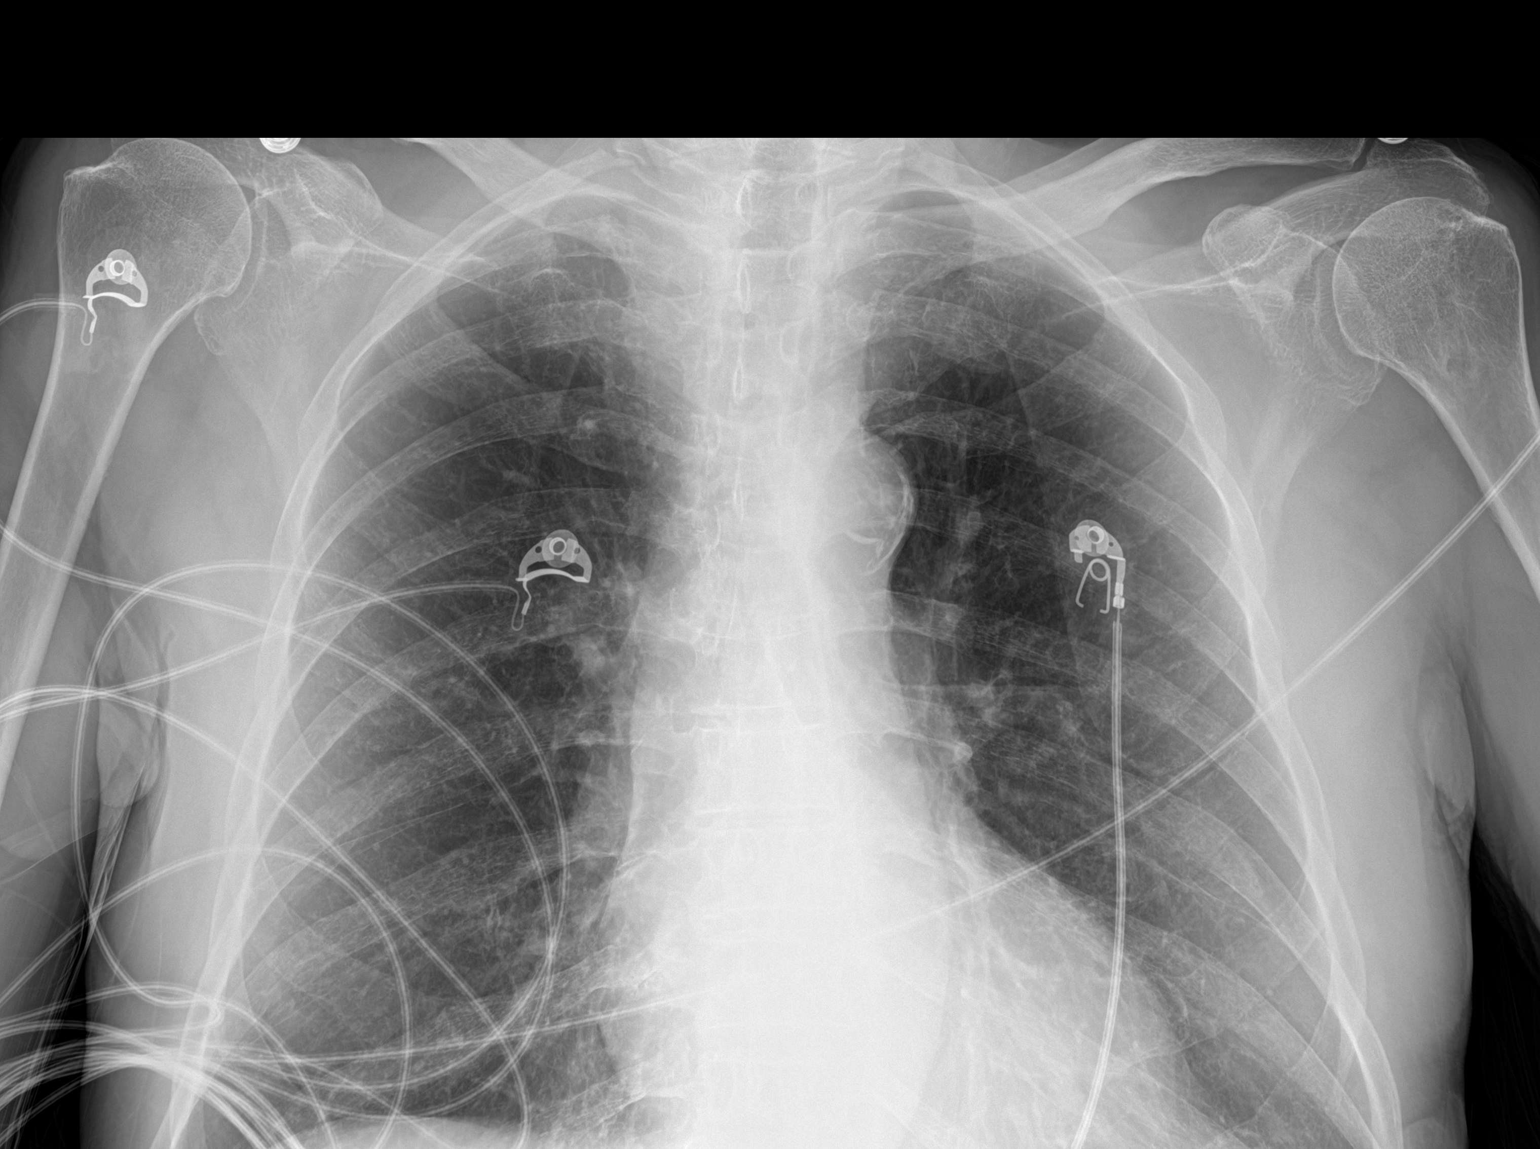

[chest ap (2 of 2)]
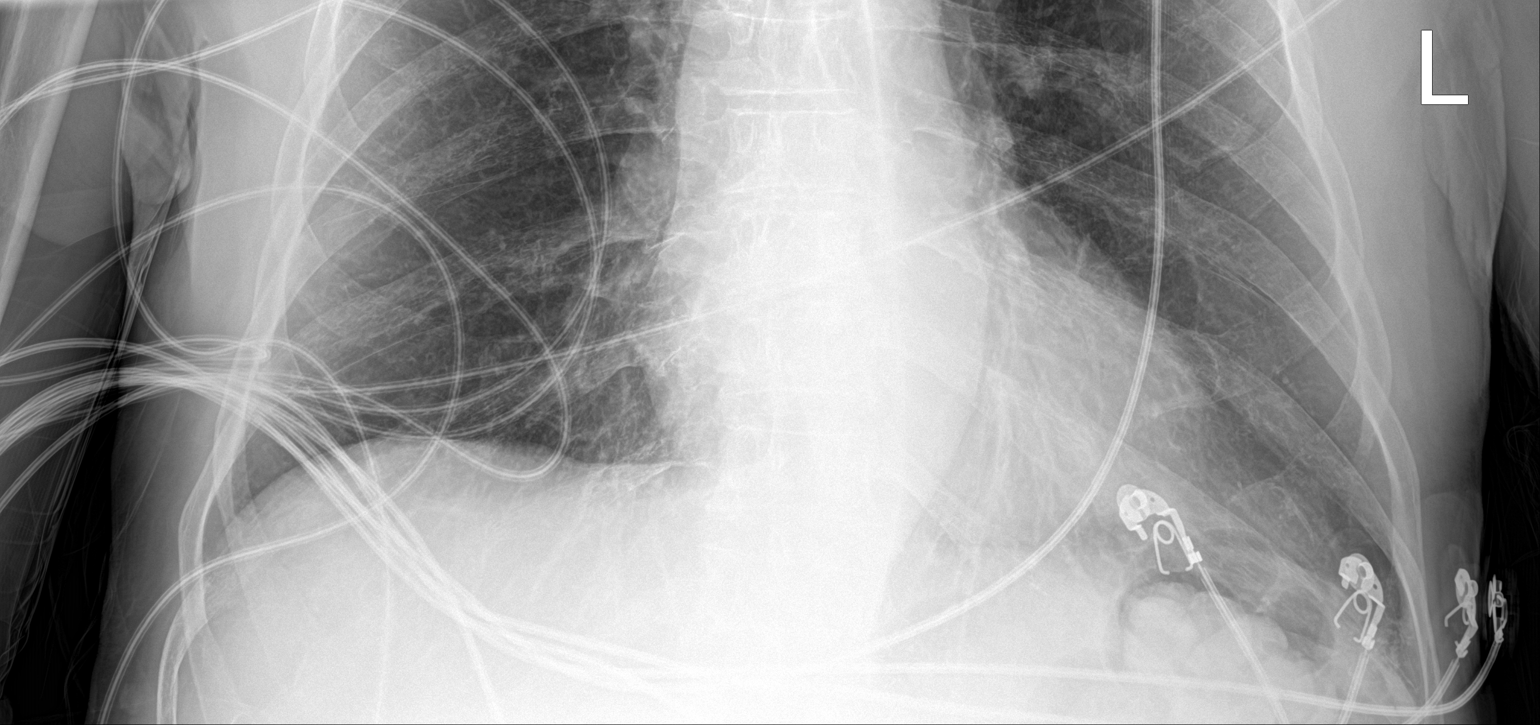

[2 of 2 positions shown; findings below may reference images not displayed]

FINDINGS: The heart size and mediastinal contours are within normal limits.
Both lungs are clear. The visualized skeletal structures are
unremarkable.
IMPRESSION: No active disease.

Aortic Atherosclerosis ([id number].J).

## 2021-01-02 IMAGING — CT CT HEAD W/O CM
4 series · 16 of 47 positions shown, 18 images · non-contrast
Comparison: 12/22/2019.

CLINICAL DATA: Dizziness

EXAM:
CT HEAD WITHOUT CONTRAST
TECHNIQUE: Contiguous axial images were obtained from the base of the skull
through the vertex without intravenous contrast.

[Series 3: head wo · axial · 0.45mm/px · z∈[+1272,+1397]mm · 7 of 35 slices shown, 9 images]
[im 5/35  brain]
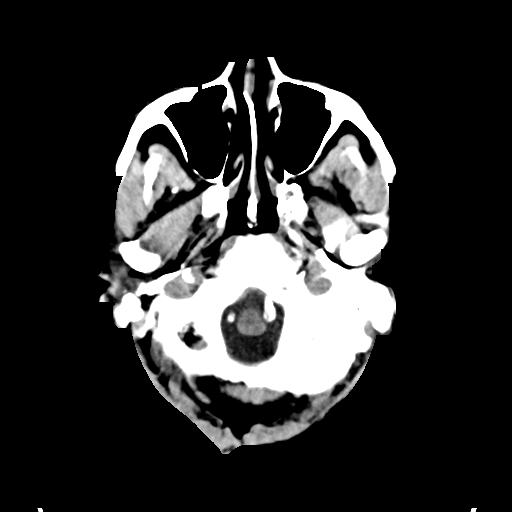
[im 5/35  bone]
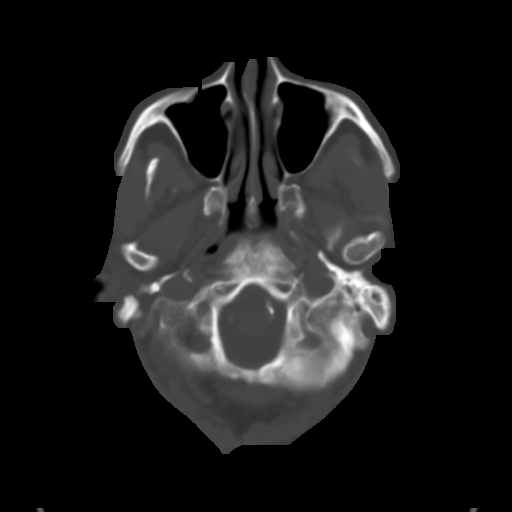
[im 9/35  brain]
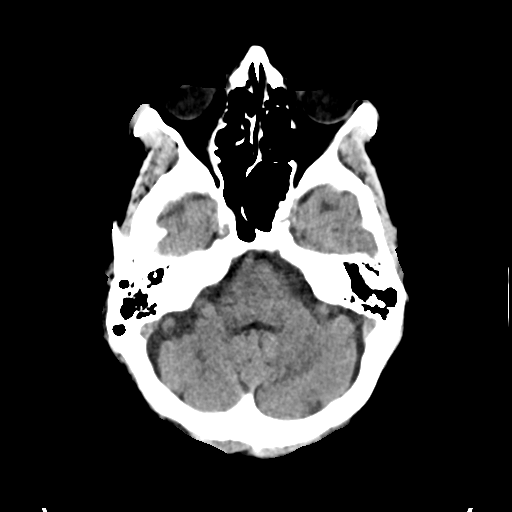
[im 13/35  brain]
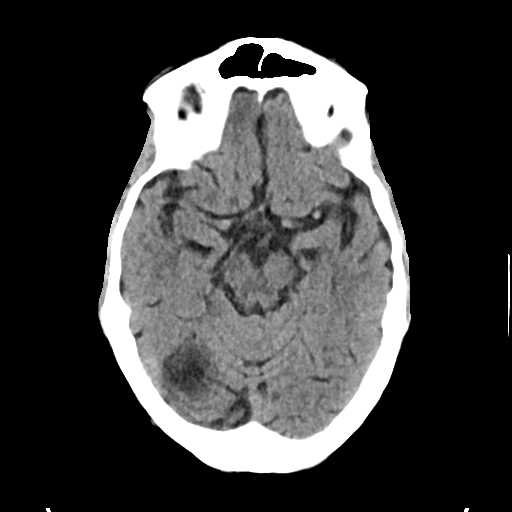
[im 18/35  brain]
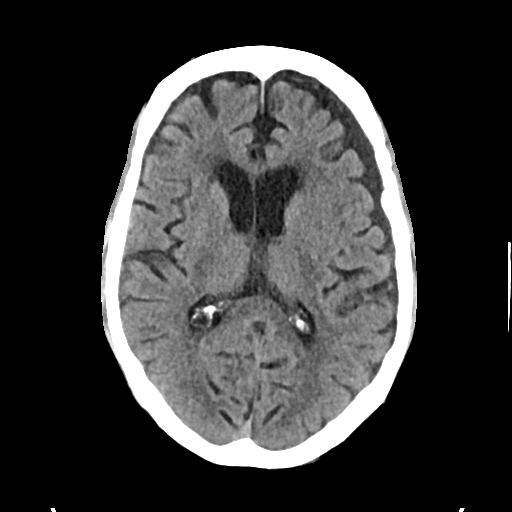
[im 22/35  brain]
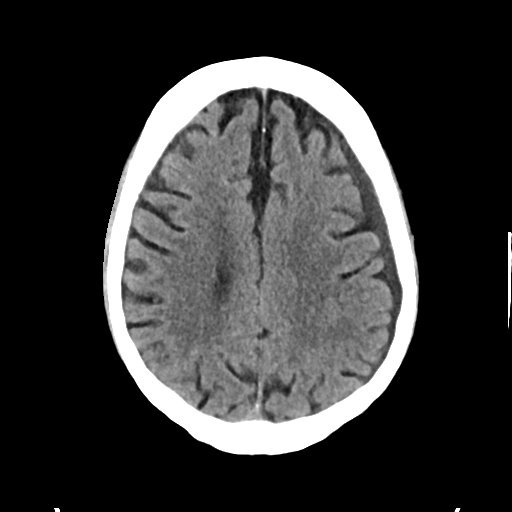
[im 22/35  bone]
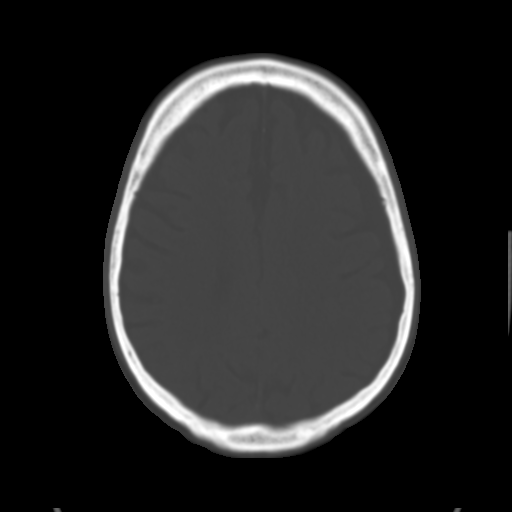
[im 26/35  brain]
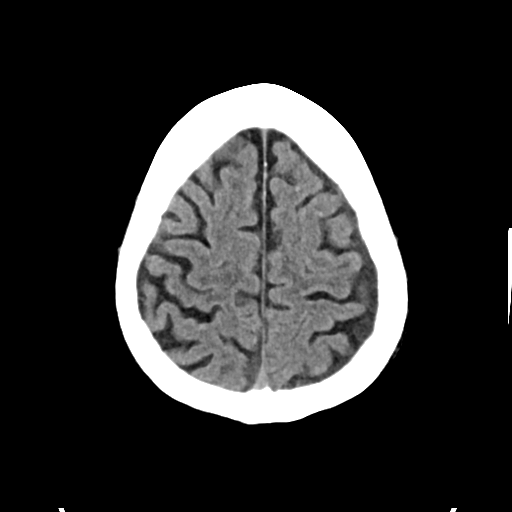
[im 30/35  brain]
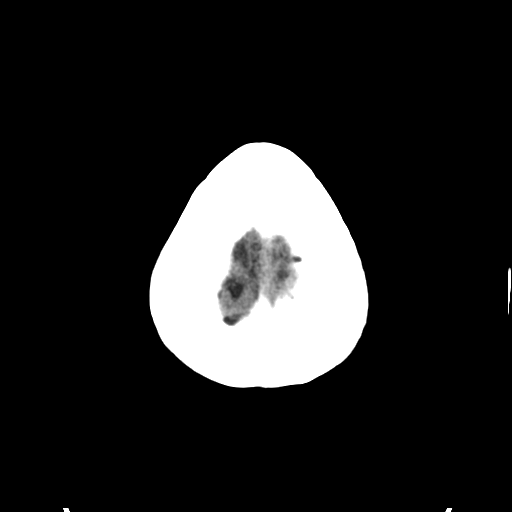

[Series 4: head bone · axial · 0.45mm/px · z∈[+1268,+1304]mm · 3 of 88 slices shown]
[im 9/88  bone]
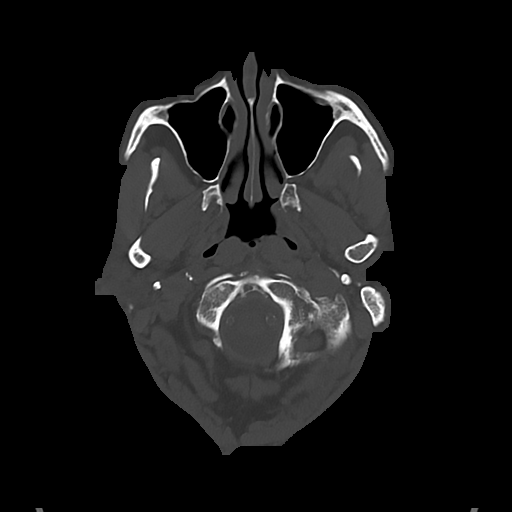
[im 18/88  bone]
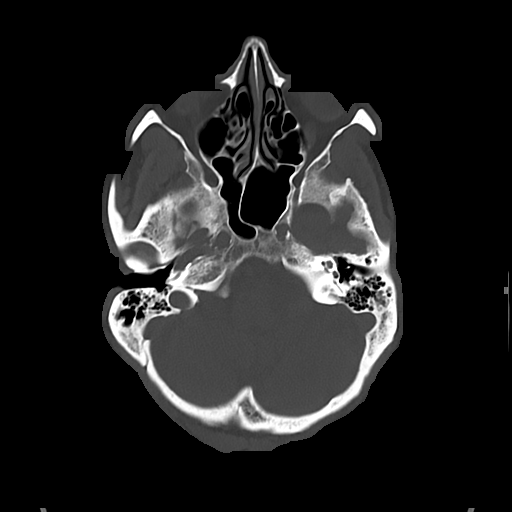
[im 27/88  bone]
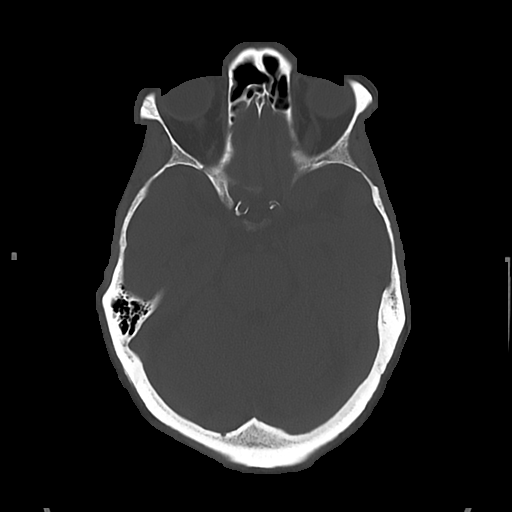

[Series 5: cor soft · coronal · 0.33mm/px · 3 of 73 slices shown]
[im 25/73  brain]
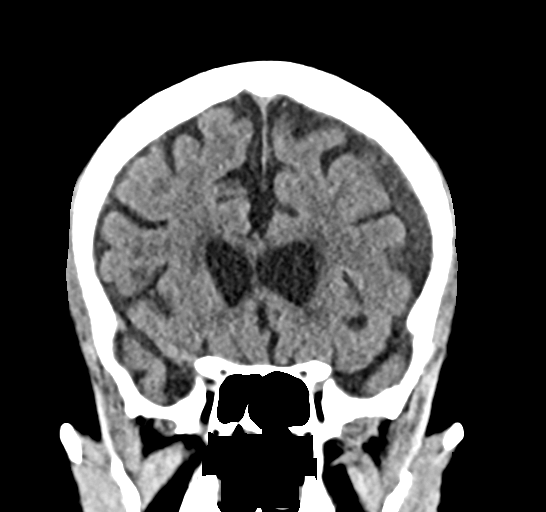
[im 33/73  brain]
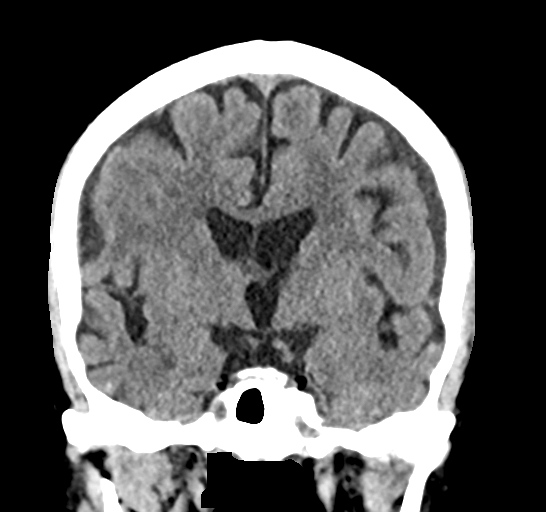
[im 41/73  brain]
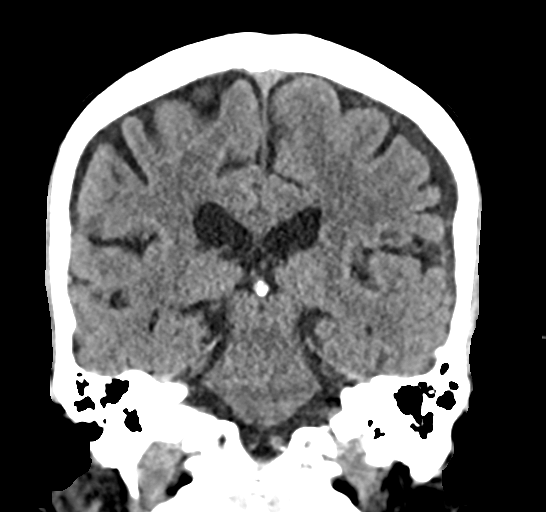

[Series 6: sag soft · sagittal · 0.34mm/px · 3 of 60 slices shown]
[im 20/60  brain]
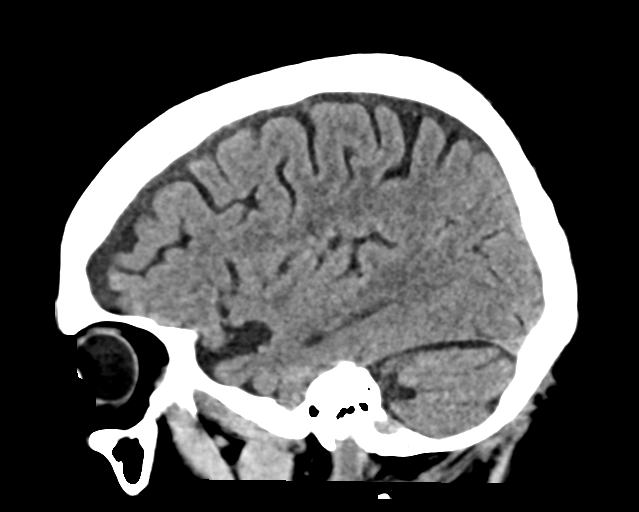
[im 30/60  brain]
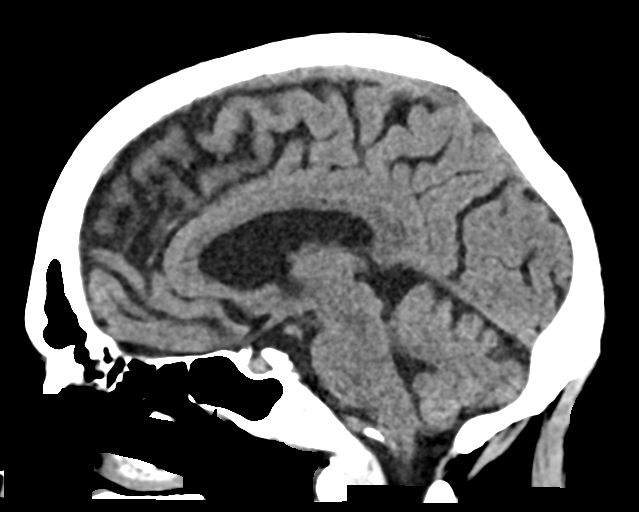
[im 40/60  brain]
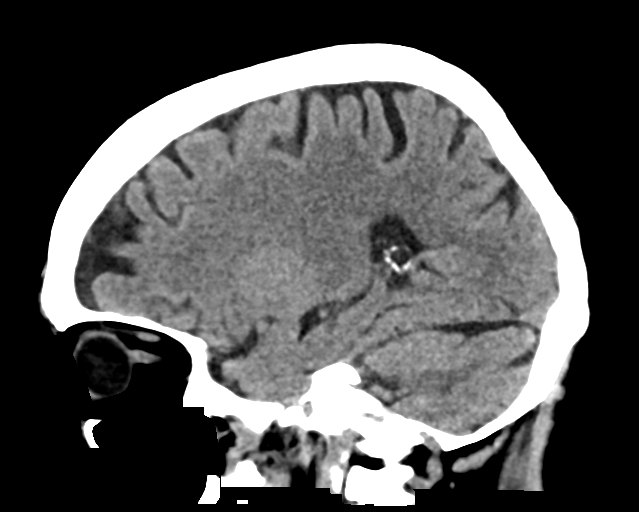

[16 of 47 positions shown; findings below may reference images not displayed]

FINDINGS: Brain: Crescentic fluid collection overlying the left cerebral
convexity which is slightly hyperattenuating relative to CSF
suggesting a subacute to chronic left subdural hematoma. Collection
measures up to 8 mm in thickness. 3 mm left-to-right shift of the
midline structures. No intraparenchymal hemorrhage. No
hydrocephalus. Gray-white matter differentiation appears preserved
without evidence of acute infarction. Mild diffuse cerebral volume
loss.

Vascular: Atherosclerotic calcifications involving the large vessels
of the skull base. No unexpected hyperdense vessel.

Skull: Normal. Negative for fracture or focal lesion.

Sinuses/Orbits: No acute finding.

Other: None.
IMPRESSION: Subacute to chronic left subdural hematoma measuring up to 8 mm in
thickness with 3 mm left-to-right shift of the midline structures,
not substantially changed from the previous CT.

## 2023-07-31 DEATH — deceased
